# Patient Record
Sex: Female | Born: 1935 | Race: White | Hispanic: No
Health system: Southern US, Community
[De-identification: ages and names within clinical notes are randomized; demographics above are authoritative.]

## PROBLEM LIST (undated history)

## (undated) DIAGNOSIS — F0280 Dementia in other diseases classified elsewhere without behavioral disturbance: Secondary | ICD-10-CM

## (undated) DIAGNOSIS — G309 Alzheimer's disease, unspecified: Secondary | ICD-10-CM

## (undated) DIAGNOSIS — F0390 Unspecified dementia without behavioral disturbance: Secondary | ICD-10-CM

## (undated) DIAGNOSIS — E785 Hyperlipidemia, unspecified: Secondary | ICD-10-CM

## (undated) DIAGNOSIS — I10 Essential (primary) hypertension: Secondary | ICD-10-CM

## (undated) DIAGNOSIS — D649 Anemia, unspecified: Secondary | ICD-10-CM

## (undated) DIAGNOSIS — E079 Disorder of thyroid, unspecified: Secondary | ICD-10-CM

## (undated) DIAGNOSIS — N390 Urinary tract infection, site not specified: Secondary | ICD-10-CM

## (undated) DIAGNOSIS — E039 Hypothyroidism, unspecified: Secondary | ICD-10-CM

## (undated) HISTORY — PX: JOINT REPLACEMENT: SHX530

---

## 2012-01-29 ENCOUNTER — Emergency Department (HOSPITAL_COMMUNITY)
Admission: EM | Admit: 2012-01-29 | Discharge: 2012-01-29 | Disposition: A | Discharge status: Home / Self Care | Payer: Medicare Other | Attending: Emergency Medicine | Admitting: Emergency Medicine

## 2012-01-29 ENCOUNTER — Emergency Department (HOSPITAL_COMMUNITY): Payer: Medicare Other

## 2012-01-29 ENCOUNTER — Encounter (HOSPITAL_COMMUNITY): Payer: Self-pay | Admitting: Emergency Medicine

## 2012-01-29 DIAGNOSIS — Y9210 Unspecified residential institution as the place of occurrence of the external cause: Secondary | ICD-10-CM | POA: Insufficient documentation

## 2012-01-29 DIAGNOSIS — S2249XA Multiple fractures of ribs, unspecified side, initial encounter for closed fracture: Secondary | ICD-10-CM | POA: Insufficient documentation

## 2012-01-29 DIAGNOSIS — W050XXA Fall from non-moving wheelchair, initial encounter: Secondary | ICD-10-CM | POA: Insufficient documentation

## 2012-01-29 DIAGNOSIS — N200 Calculus of kidney: Secondary | ICD-10-CM | POA: Insufficient documentation

## 2012-01-29 DIAGNOSIS — S2231XA Fracture of one rib, right side, initial encounter for closed fracture: Secondary | ICD-10-CM

## 2012-01-29 DIAGNOSIS — R609 Edema, unspecified: Secondary | ICD-10-CM

## 2012-01-29 DIAGNOSIS — Z79899 Other long term (current) drug therapy: Secondary | ICD-10-CM | POA: Insufficient documentation

## 2012-01-29 HISTORY — DX: Dementia in other diseases classified elsewhere, unspecified severity, without behavioral disturbance, psychotic disturbance, mood disturbance, and anxiety: F02.80

## 2012-01-29 HISTORY — DX: Essential (primary) hypertension: I10

## 2012-01-29 HISTORY — DX: Urinary tract infection, site not specified: N39.0

## 2012-01-29 HISTORY — DX: Hypothyroidism, unspecified: E03.9

## 2012-01-29 HISTORY — DX: Disorder of thyroid, unspecified: E07.9

## 2012-01-29 HISTORY — DX: Unspecified dementia, unspecified severity, without behavioral disturbance, psychotic disturbance, mood disturbance, and anxiety: F03.90

## 2012-01-29 HISTORY — DX: Anemia, unspecified: D64.9

## 2012-01-29 HISTORY — DX: Hyperlipidemia, unspecified: E78.5

## 2012-01-29 HISTORY — DX: Alzheimer's disease, unspecified: G30.9

## 2012-01-29 LAB — URINALYSIS, ROUTINE W REFLEX MICROSCOPIC
Glucose, UA: NEGATIVE mg/dL
Ketones, ur: NEGATIVE mg/dL
Protein, ur: >300 mg/dL — AB
Urobilinogen, UA: 0.2 mg/dL (ref 0.0–1.0)

## 2012-01-29 LAB — POCT I-STAT, CHEM 8
Chloride: 106 mEq/L (ref 96–112)
HCT: 34 % — ABNORMAL LOW (ref 36.0–46.0)
Hemoglobin: 11.6 g/dL — ABNORMAL LOW (ref 12.0–15.0)
Potassium: 3.4 mEq/L — ABNORMAL LOW (ref 3.5–5.1)
Sodium: 142 mEq/L (ref 135–145)

## 2012-01-29 LAB — URINE MICROSCOPIC-ADD ON

## 2012-01-29 MED ORDER — ACETAMINOPHEN-CODEINE #3 300-30 MG PO TABS: 1.0000 | ORAL_TABLET | Freq: Four times a day (QID) | ORAL | Status: AC | PRN

## 2012-01-29 MED ORDER — NAPROXEN 500 MG PO TABS
500.0000 mg | ORAL_TABLET | ORAL | Status: AC
  Administered 2012-01-29: 500 mg via ORAL
  Filled 2012-01-29: qty 1

## 2012-01-29 MED ORDER — FUROSEMIDE 20 MG PO TABS: 20.0000 mg | ORAL_TABLET | Freq: Two times a day (BID) | ORAL | Status: AC

## 2012-01-29 MED ORDER — NAPROXEN 500 MG PO TABS: 500.0000 mg | ORAL_TABLET | Freq: Two times a day (BID) | ORAL | Status: AC

## 2012-01-29 NOTE — ED Notes (Addendum)
Pt seating in chair and slip off the chair to the floor, staff stated that pt  denies any injury and pain, wanted pt to be evaluated,  Called EMs

## 2012-01-29 NOTE — ED Provider Notes (Signed)
History     CSN: [id number]  Arrival date & time 01/29/12  9147   First MD Initiated Contact with Patient 01/29/12 [phone]      Chief Complaint  Patient presents with  . Fall    (Consider location/radiation/quality/duration/timing/severity/associated sxs/prior treatment) HPI Comments: 78 year old female who presents after sliding out of her wheelchair at the nursing home. According to the nursing home staff per the paramedics the patient has been trying to use the bathroom tonight but has been unable to give a urine sample.  The patient states that she needs to use the bathroom and states that she was trying to get to the bathroom when she slid out of her wheelchair this evening. She denies having any abdominal pain, fever, nausea vomiting. She does admit to having right-sided pain and back pain after her fall. She states that she slid out of her wheelchair, she states that she remembers the fall and denies any chest pain or syncopal episodes. This occurred just prior to arrival.  According to the patient's medication record which is the only documentation that accompanies her she has a history of dementia but is not on any diuretics for swelling.  Review of systems and history is limited by dementia  Patient is a 78 y.o. female presenting with fall. The history is provided by the patient and the EMS personnel. History Limited By: Dementia.  Fall    No past medical history on file.  No past surgical history on file.  No family history on file.  History  Substance Use Topics  . Smoking status: Not on file  . Smokeless tobacco: Not on file  . Alcohol Use: No    OB History    Grav Para Term Preterm Abortions TAB SAB Ect Mult Living                  Review of Systems  Unable to perform ROS   Allergies  Review of patient's allergies indicates no known allergies.  Home Medications   Current Outpatient Rx  Name Route Sig Dispense Refill  . ALPRAZOLAM 0.5 MG PO TABS Oral  Take 0.5 mg by mouth 2 (two) times daily as needed. For anxiety/agitation    . ATORVASTATIN CALCIUM 40 MG PO TABS Oral Take 40 mg by mouth at bedtime.    Marland Kitchen BENZONATATE 200 MG PO CAPS Oral Take 200 mg by mouth 3 (three) times daily as needed. For cough    . CIPROFLOXACIN HCL 500 MG PO TABS Oral Take 500 mg by mouth 2 (two) times daily. For 10 days    . DONEPEZIL HCL 10 MG PO TABS Oral Take 10 mg by mouth at bedtime.    . FOSINOPRIL SODIUM 20 MG PO TABS Oral Take 20 mg by mouth daily.    Marland Kitchen HYDROCODONE-ACETAMINOPHEN 5-500 MG PO TABS Oral Take 1 tablet by mouth every 6 (six) hours as needed. (Not to exceed 4 grams of acetaminophen in 24 hours)    . INSULIN GLARGINE 100 UNIT/ML [place] SOLN Subcutaneous Inject 15 Units into the skin at bedtime.    . INSULIN REGULAR HUMAN 100 UNIT/ML IJ SOLN Subcutaneous Inject into the skin 3 (three) times daily before meals.    Marland Kitchen LEVOTHYROXINE SODIUM 125 MCG PO TABS Oral Take 125 mcg by mouth daily.    Marland Kitchen MEMANTINE HCL 10 MG PO TABS Oral Take 10 mg by mouth 2 (two) times daily.    Marland Kitchen METOPROLOL TARTRATE 25 MG PO TABS Oral Take 25 mg by  mouth 2 (two) times daily.    Marland Kitchen MIRTAZAPINE 30 MG PO TABS Oral Take 30 mg by mouth at bedtime.    . NYSTATIN 100000 UNIT/GM EX CREA Topical Apply 1 application topically daily. To vaginal area for 1 month    . PANTOPRAZOLE SODIUM 40 MG PO TBEC Oral Take 40 mg by mouth daily.    Marland Kitchen POLYETHYLENE GLYCOL 3350 PO PACK Oral Take 17 g by mouth daily as needed. For constipation    . POTASSIUM CHLORIDE CRYS ER 20 MEQ PO TBCR Oral Take 20 mEq by mouth daily.    . QUETIAPINE FUMARATE 25 MG PO TABS Oral Take 25 mg by mouth at bedtime.    Marland Kitchen QUETIAPINE FUMARATE 25 MG PO TABS Oral Take 25 mg by mouth every 6 (six) hours as needed. For agitation/anxiety    . VITAMIN D (ERGOCALCIFEROL) 50000 UNITS PO CAPS Oral Take 50,000 Units by mouth every 7 (seven) days. Wednesdays    . DESITIN CREAMY EX Apply externally Apply 1 application topically daily. For red area on  buttocks    . ACETAMINOPHEN-CODEINE #3 300-30 MG PO TABS Oral Take 1-2 tablets by mouth every 6 (six) hours as needed for pain. 15 tablet 0  . FUROSEMIDE 20 MG PO TABS Oral Take 1 tablet (20 mg total) by mouth 2 (two) times daily. 15 tablet 0  . NAPROXEN 500 MG PO TABS Oral Take 1 tablet (500 mg total) by mouth 2 (two) times daily with a meal. 30 tablet 0    BP 150/71  Pulse 79  Temp(Src) 97.3 F (36.3 C) (Oral)  Resp 18  SpO2 91%  Physical Exam  Nursing note and vitals reviewed. Constitutional: She appears well-developed and well-nourished. No distress.  HENT:  Head: Normocephalic and atraumatic.  Mouth/Throat: Oropharynx is clear and moist. No oropharyngeal exudate.       No signs of trauma about the head  Eyes: Conjunctivae and EOM are normal. Pupils are equal, round, and reactive to light. Right eye exhibits no discharge. Left eye exhibits no discharge. No scleral icterus.  Neck: Normal range of motion. Neck supple. No JVD present. No thyromegaly present.  Cardiovascular: Normal rate, regular rhythm, normal heart sounds and intact distal pulses.  Exam reveals no gallop and no friction rub.   No murmur heard. Pulmonary/Chest: Effort normal and breath sounds normal. No respiratory distress. She has no wheezes. She has no rales.  Abdominal: Soft. Bowel sounds are normal. She exhibits no distension and no mass. There is no tenderness.  Musculoskeletal: Normal range of motion. She exhibits edema ( bilateral 2+ pitting edema) and tenderness ( Mild tenderness to the right lateral ribs, lumbar spine is mild).  Lymphadenopathy:    She has no cervical adenopathy.  Neurological: She is alert. Coordination normal.  Skin: Skin is warm and dry. No rash noted. No erythema.  Psychiatric: She has a normal mood and affect. Her behavior is normal.    ED Course  Procedures (including critical care time)  Labs Reviewed  URINALYSIS, ROUTINE W REFLEX MICROSCOPIC - Abnormal; Notable for the  following:    Hgb urine dipstick SMALL (*)    Protein, ur >300 (*)    All other components within normal limits  POCT I-STAT, CHEM 8 - Abnormal; Notable for the following:    Potassium 3.4 (*)    Glucose, Bld 166 (*)    Hemoglobin 11.6 (*)    HCT 34.0 (*)    All other components within normal limits  URINE  MICROSCOPIC-ADD ON  URINE CULTURE   Dg Ribs Unilateral W/chest Right  01/29/2012  *RADIOLOGY REPORT*  Clinical Data: Right lateral rib and lower back pain status post fall.  RIGHT RIBS AND CHEST - 3+ VIEW  Comparison: None.  Findings: The heart size upper normal limits.  Aortic arch atherosclerosis and mild tortuosity.  Mild central vascular congestion.  No focal consolidation or pneumothorax.  There are nondisplaced lateral right ninth and tenth rib fractures. Anterolateral 8th rib is questionable.  High-riding bilateral humeral heads suggests underlying rotator cuff pathology. Correlate with the range of motion to exclude dislocation. Probable calcific tendinosis.  IMPRESSION: Nondisplaced lateral right ninth and tenth rib fractures. Questionable anterolateral 8th rib fracture as well.  No pneumothorax.  Original Report Authenticated By: Waneta Martins, M.D.   Dg Lumbar Spine Complete  01/29/2012  *RADIOLOGY REPORT*  Clinical Data: Low back pain status post fall.  LUMBAR SPINE - COMPLETE 4+ VIEW  Comparison: None.  Findings: Advanced multilevel degenerative changes with anterior osteophytes, facet arthropathy, and disc height loss. Minimal anterior wedging at T12.  No definite acute fracture or dislocation.  No aggressive osseous lesion.  Advanced atherosclerotic calcification of the aorta and branch vessels. Partially imaged S10-3 component of a right hip arthroplasty. There are a couple calcific densities projecting over the right quadrant, favored to reflect renal stones.  Oval calcific density projecting over the left upper quadrant may reflect a small splenic artery pseudoaneurysm.   IMPRESSION: Multilevel degenerative changes.  No acute osseous abnormality identified.  Nonobstructing right renal stones.  Original Report Authenticated By: Waneta Martins, M.D.     1. Rib fracture, right, closed, initial encounter   2. Peripheral edema       MDM  Mild skin abrasion, to the left lateral proximal lower extremity below the knee.  Has diffuse bilateral lower extremity edema.  The acute injuries are to the low back and the R ribs - x-rays pending, r/o UTI.   X-rays show rib fractures on the right, incentive spirometer and pain medication given, pain medicines for home. Lasix started, normal renal function, to followup with family doctor in 2 days.  Definitive Fracture Care:  Definitive fracture care performred for the right rib fractures.   This included analgesia in the ED, incentive spiromoterly,which have been provided.  I have counseled the pt on possible complications of the fractures and signs and symptoms which would mandate return for further care.   Vida Roller, MD 01/29/12 0600

## 2012-01-29 NOTE — ED Notes (Signed)
Pt stated fall of the chair,  stated some pain to hip,

## 2012-01-29 NOTE — ED Notes (Signed)
Patient transported to X-ray 

## 2012-01-29 NOTE — Discharge Instructions (Signed)
You have to small fractures on the right side, he also have excessive swelling in your legs which will require a special fluid pill to help you get rid of some of this fluid. Please have your family Dr. reevaluate you in no more than 48 hours for a repeat check on your legs. If you should develop increased difficulty breathing, fevers or severe cough return to the hospital immediately as occasionally people will develop pneumonia after rib fractures. Use the incentive spirometer every 30 minutes to help prevent lung collapse.  Naprosyn for pain.

## 2012-01-30 LAB — URINE CULTURE
Colony Count: NO GROWTH
Culture: NO GROWTH

## 2015-03-19 ENCOUNTER — Emergency Department (HOSPITAL_COMMUNITY): Payer: Medicare Other

## 2015-03-19 ENCOUNTER — Encounter (HOSPITAL_COMMUNITY): Payer: Self-pay | Admitting: Emergency Medicine

## 2015-03-19 ENCOUNTER — Emergency Department (HOSPITAL_COMMUNITY)
Admission: EM | Admit: 2015-03-19 | Discharge: 2015-03-19 | Disposition: A | Discharge status: Home / Self Care | Payer: Medicare Other | Attending: Emergency Medicine | Admitting: Emergency Medicine

## 2015-03-19 DIAGNOSIS — W182XXA Fall in (into) shower or empty bathtub, initial encounter: Secondary | ICD-10-CM | POA: Insufficient documentation

## 2015-03-19 DIAGNOSIS — I10 Essential (primary) hypertension: Secondary | ICD-10-CM | POA: Insufficient documentation

## 2015-03-19 DIAGNOSIS — Y929 Unspecified place or not applicable: Secondary | ICD-10-CM | POA: Insufficient documentation

## 2015-03-19 DIAGNOSIS — Y9389 Activity, other specified: Secondary | ICD-10-CM | POA: Insufficient documentation

## 2015-03-19 DIAGNOSIS — Z87440 Personal history of urinary (tract) infections: Secondary | ICD-10-CM | POA: Diagnosis not present

## 2015-03-19 DIAGNOSIS — E079 Disorder of thyroid, unspecified: Secondary | ICD-10-CM | POA: Insufficient documentation

## 2015-03-19 DIAGNOSIS — Y92121 Bathroom in nursing home as the place of occurrence of the external cause: Secondary | ICD-10-CM | POA: Insufficient documentation

## 2015-03-19 DIAGNOSIS — G309 Alzheimer's disease, unspecified: Secondary | ICD-10-CM | POA: Diagnosis not present

## 2015-03-19 DIAGNOSIS — Y999 Unspecified external cause status: Secondary | ICD-10-CM | POA: Diagnosis not present

## 2015-03-19 DIAGNOSIS — E119 Type 2 diabetes mellitus without complications: Secondary | ICD-10-CM | POA: Diagnosis not present

## 2015-03-19 DIAGNOSIS — Z79899 Other long term (current) drug therapy: Secondary | ICD-10-CM | POA: Diagnosis not present

## 2015-03-19 DIAGNOSIS — Z792 Long term (current) use of antibiotics: Secondary | ICD-10-CM | POA: Insufficient documentation

## 2015-03-19 DIAGNOSIS — W19XXXA Unspecified fall, initial encounter: Secondary | ICD-10-CM

## 2015-03-19 DIAGNOSIS — Z862 Personal history of diseases of the blood and blood-forming organs and certain disorders involving the immune mechanism: Secondary | ICD-10-CM | POA: Insufficient documentation

## 2015-03-19 DIAGNOSIS — Z043 Encounter for examination and observation following other accident: Secondary | ICD-10-CM | POA: Diagnosis present

## 2015-03-19 DIAGNOSIS — F0280 Dementia in other diseases classified elsewhere without behavioral disturbance: Secondary | ICD-10-CM | POA: Diagnosis not present

## 2015-03-19 DIAGNOSIS — M12811 Other specific arthropathies, not elsewhere classified, right shoulder: Secondary | ICD-10-CM

## 2015-03-19 DIAGNOSIS — Z794 Long term (current) use of insulin: Secondary | ICD-10-CM | POA: Diagnosis not present

## 2015-03-19 DIAGNOSIS — E785 Hyperlipidemia, unspecified: Secondary | ICD-10-CM | POA: Insufficient documentation

## 2015-03-19 DIAGNOSIS — S40911A Unspecified superficial injury of right shoulder, initial encounter: Secondary | ICD-10-CM | POA: Insufficient documentation

## 2015-03-19 NOTE — ED Provider Notes (Addendum)
CSN: [id number]     Arrival date & time 03/19/15  0865 History   First MD Initiated Contact with Patient 03/19/15 [phone]     Chief Complaint  Patient presents with  . Fall   HPI Patient presents to the emergency room for evaluation after a fall in the shower. The patient's a resident of a nursing care facility in the memory care unit. According to the EMS report the patient was in shower chair. She leaned forward and fell out of the chair. The patient complained of back pain to the staff at the facility. They contacted EMS to have her brought to the emergency room for evaluation. The patient does not remember what happened this morning. She is annoyed that she is here in the emergency room. She just wants to get out of here and go back home.  Patient states "nothing is wrong with me." Patient denies any pain. She denies any headache. She denies any difficulty breathing. She specifically denies any pain in her lower back. Past Medical History  Diagnosis Date  . Hypertension   . UTI (lower urinary tract infection)   . Anemia   . Diabetes mellitus   . Dementia   . Thyroid disease   . Hyperlipidemia   . Hypothyroid   . Alzheimer disease    Past Surgical History  Procedure Laterality Date  . Joint replacement     No family history on file. History  Substance Use Topics  . Smoking status: Not on file  . Smokeless tobacco: Not on file  . Alcohol Use: No   OB History    No data available     Review of Systems  All other systems reviewed and are negative.     Allergies  Review of patient's allergies indicates no known allergies.  Home Medications   Prior to Admission medications   Medication Sig Start Date End Date Taking? Authorizing Provider  ALPRAZolam Prudy Feeler) 0.5 MG tablet Take 0.5 mg by mouth 2 (two) times daily as needed. For anxiety/agitation    Historical Provider, MD  atorvastatin (LIPITOR) 40 MG tablet Take 40 mg by mouth at bedtime.    Historical Provider, MD   benzonatate (TESSALON) 200 MG capsule Take 200 mg by mouth 3 (three) times daily as needed. For cough    Historical Provider, MD  ciprofloxacin (CIPRO) 500 MG tablet Take 500 mg by mouth 2 (two) times daily. For 10 days    Historical Provider, MD  donepezil (ARICEPT) 10 MG tablet Take 10 mg by mouth at bedtime.    Historical Provider, MD  fosinopril (MONOPRIL) 20 MG tablet Take 20 mg by mouth daily.    Historical Provider, MD  furosemide (LASIX) 20 MG tablet Take 1 tablet (20 mg total) by mouth 2 (two) times daily. 01/29/12 01/28/13  Eber Hong, MD  HYDROcodone-acetaminophen (VICODIN) 5-500 MG per tablet Take 1 tablet by mouth every 6 (six) hours as needed. (Not to exceed 4 grams of acetaminophen in 24 hours)    Historical Provider, MD  insulin glargine (LANTUS) 100 UNIT/ML injection Inject 15 Units into the skin at bedtime.    Historical Provider, MD  insulin regular (NOVOLIN R,HUMULIN R) 100 units/mL injection Inject into the skin 3 (three) times daily before meals.    Historical Provider, MD  levothyroxine (SYNTHROID, LEVOTHROID) 125 MCG tablet Take 125 mcg by mouth daily.    Historical Provider, MD  memantine (NAMENDA) 10 MG tablet Take 10 mg by mouth 2 (two) times daily.  Historical Provider, MD  metoprolol tartrate (LOPRESSOR) 25 MG tablet Take 25 mg by mouth 2 (two) times daily.    Historical Provider, MD  mirtazapine (REMERON) 30 MG tablet Take 30 mg by mouth at bedtime.    Historical Provider, MD  nystatin cream (MYCOSTATIN) Apply 1 application topically daily. To vaginal area for 1 month    Historical Provider, MD  pantoprazole (PROTONIX) 40 MG tablet Take 40 mg by mouth daily.    Historical Provider, MD  polyethylene glycol (MIRALAX / GLYCOLAX) packet Take 17 g by mouth daily as needed. For constipation    Historical Provider, MD  potassium chloride SA (K-DUR,KLOR-CON) 20 MEQ tablet Take 20 mEq by mouth daily.    Historical Provider, MD  QUEtiapine (SEROQUEL) 25 MG tablet Take 25 mg by  mouth at bedtime.    Historical Provider, MD  QUEtiapine (SEROQUEL) 25 MG tablet Take 25 mg by mouth every 6 (six) hours as needed. For agitation/anxiety    Historical Provider, MD  Vitamin D, Ergocalciferol, (DRISDOL) 50000 UNITS CAPS Take 50,000 Units by mouth every 7 (seven) days. Wednesdays    Historical Provider, MD  Zinc Oxide (DESITIN CREAMY EX) Apply 1 application topically daily. For red area on buttocks    Historical Provider, MD   BP 109/59 mmHg  Pulse 63  Temp(Src) 97.9 F (36.6 C) (Oral)  Resp 18  SpO2 94% Physical Exam  Constitutional: No distress.  Elderly  HENT:  Head: Normocephalic and atraumatic.  Right Ear: External ear normal.  Left Ear: External ear normal.  Eyes: Conjunctivae are normal. Right eye exhibits no discharge. Left eye exhibits no discharge. No scleral icterus.  Neck: Neck supple. No tracheal deviation present.  Cardiovascular: Normal rate, regular rhythm and intact distal pulses.   Pulmonary/Chest: Effort normal and breath sounds normal. No stridor. No respiratory distress. She has no wheezes. She has no rales.  Abdominal: Soft. Bowel sounds are normal. She exhibits no distension. There is no tenderness. There is no rebound and no guarding.  Musculoskeletal: She exhibits no edema or tenderness.       Right shoulder: She exhibits decreased range of motion. She exhibits no bony tenderness and no deformity.       Left shoulder: Normal.       Right hip: Normal. She exhibits normal range of motion, normal strength and no tenderness.       Left hip: Normal. She exhibits normal range of motion, normal strength and no tenderness.       Cervical back: She exhibits no bony tenderness and no swelling.       Thoracic back: She exhibits no bony tenderness and no swelling.       Lumbar back: She exhibits no bony tenderness and no swelling.  Neurological: She is alert. She has normal strength. No cranial nerve deficit (no facial droop, extraocular movements intact, no  slurred speech) or sensory deficit. She exhibits normal muscle tone. She displays no seizure activity. Coordination normal.  Patient is alert and awake, answering questions appropriately, her short-term memory is poor  Skin: Skin is warm and dry. No rash noted. She is not diaphoretic.  Psychiatric: She has a normal mood and affect.  Nursing note and vitals reviewed.   ED Course  Procedures (including critical care time) Labs Review Labs Reviewed - No data to display  Imaging Review Dg Lumbar Spine Complete  03/19/2015   CLINICAL DATA:  Status post fall in the shower this morning. Low back pain. Initial encounter.  EXAM: LUMBAR SPINE - COMPLETE 4+ VIEW  COMPARISON:  Plain films lumbar spine 01/29/2012.  FINDINGS: No fracture is identified. There is multilevel spondylosis without marked interval change. Atherosclerotic vascular disease is noted.  IMPRESSION: No acute abnormality.  Multilevel spondylosis.  Atherosclerosis.   Electronically Signed   By: Drusilla Kannerhomas  Dalessio M.D.   On: 03/19/2015 10:42   Dg Shoulder Right  03/19/2015   CLINICAL DATA:  Status post fall this morning. Right shoulder pain. Initial encounter.  EXAM: RIGHT SHOULDER - 2+ VIEW  COMPARISON:  None.  FINDINGS: No acute bony or joint abnormality is identified. The humeral head is markedly high-riding consistent with chronic rotator cuff tear. There is advanced glenohumeral osteoarthritis. Calcification projecting over the greater tuberosity is most likely due to calcific tendinosis of the rotator cuff. Image right lung and ribs are unremarkable.  IMPRESSION: No acute abnormality.  Advanced glenohumeral osteoarthritis.  High-riding humeral head consistent with chronic rotator cuff tear.   Electronically Signed   By: Drusilla Kannerhomas  Dalessio M.D.   On: 03/19/2015 10:39     MDM   Final diagnoses:  Fall, initial encounter  Rotator cuff arthropathy, right    Pt had a fall in the bathtub today.  Pt denies any complaints.  She had some  soreness with shoulder movement.  It appears to be related to her chronic arthropathy.  No fracture or dislocation.  She denies back pain and has no ttp on exam.  Doubt head injury.  No external signs of injury.  Neuro exam is normal.  No anticoagulants.    At this time there does not appear to be any evidence of an acute emergency medical condition and the patient appears stable for discharge with appropriate outpatient follow up.    Linwood DibblesJon Nautica Hotz, MD 03/19/15 1051  We attempted to get the patient to walk before discharge but she refused to do so.  She said she could walk fine but did not want to.  Daughter was contacted who informed us that the patient does not walk anymore an uses a wheelchair.  She forgets that she uses the wheelchair.  Linwood DibblesJon Ramani Riva, MD 03/19/15 [phone]

## 2015-03-19 NOTE — ED Notes (Signed)
Pt refusing to get up.   Spoke with pt's daughter on the phone, who informed me that pt is wheelchair bound and doesn't walk at all.

## 2015-03-19 NOTE — ED Notes (Signed)
Per EMS-sitting in shower and went to move and fell to supine position-did not loose consciousness-[id number]-complaining of back pain, per nursing home, she always complains of back pain-history of dementia-no change in mental status per nursing facility

## 2015-03-19 NOTE — Discharge Instructions (Signed)
Arthritis, Nonspecific °Arthritis is pain, redness, warmth, or puffiness (inflammation) of a joint. The joint may be stiff or hurt when you move it. One or more joints may be affected. There are many types of arthritis. Your doctor may not know what type you have right away. The most common cause of arthritis is wear and tear on the joint (osteoarthritis). °HOME CARE  °· Only take medicine as told by your doctor. °· Rest the joint as much as possible. °· Raise (elevate) your joint if it is puffy. °· Use crutches if the painful joint is in your leg. °· Drink enough fluids to keep your pee (urine) clear or pale yellow. °· Follow your doctor's diet instructions. °· Use cold packs for very bad joint pain for 10 to 15 minutes every hour. Ask your doctor if it is okay for you to use hot packs. °· Exercise as told by your doctor. °· Take a warm shower if you have stiffness in the morning. °· Move your sore joints throughout the day. °GET HELP RIGHT AWAY IF:  °· You have a fever. °· You have very bad joint pain, puffiness, or redness. °· You have many joints that are painful and puffy. °· You are not getting better with treatment. °· You have very bad back pain or leg weakness. °· You cannot control when you poop (bowel movement) or pee (urinate). °· You do not feel better in 24 hours or are getting worse. °· You are having side effects from your medicine. °MAKE SURE YOU:  °· Understand these instructions. °· Will watch your condition. °· Will get help right away if you are not doing well or get worse. °Document Released: 11/22/2009 Document Revised: 02/27/2012 Document Reviewed: 11/22/2009 °[employer]® Patient Information ©2015 [employer]. This information is not intended to replace advice given to you by your health care provider. Make sure you discuss any questions you have with your health care provider. ° °

## 2015-03-19 NOTE — ED Notes (Signed)
Bed: WA14 Expected date:  Expected time:  Means of arrival:  Comments: EMS-fall 

## 2015-05-29 ENCOUNTER — Emergency Department (HOSPITAL_COMMUNITY)
Admission: EM | Admit: 2015-05-29 | Discharge: 2015-05-29 | Disposition: A | Discharge status: Home / Self Care | Payer: Medicare Other | Attending: Emergency Medicine | Admitting: Emergency Medicine

## 2015-05-29 ENCOUNTER — Emergency Department (HOSPITAL_COMMUNITY): Payer: Medicare Other

## 2015-05-29 DIAGNOSIS — E039 Hypothyroidism, unspecified: Secondary | ICD-10-CM | POA: Diagnosis not present

## 2015-05-29 DIAGNOSIS — Y998 Other external cause status: Secondary | ICD-10-CM | POA: Insufficient documentation

## 2015-05-29 DIAGNOSIS — Z79899 Other long term (current) drug therapy: Secondary | ICD-10-CM | POA: Diagnosis not present

## 2015-05-29 DIAGNOSIS — S3992XA Unspecified injury of lower back, initial encounter: Secondary | ICD-10-CM | POA: Insufficient documentation

## 2015-05-29 DIAGNOSIS — G309 Alzheimer's disease, unspecified: Secondary | ICD-10-CM | POA: Insufficient documentation

## 2015-05-29 DIAGNOSIS — Y9289 Other specified places as the place of occurrence of the external cause: Secondary | ICD-10-CM | POA: Diagnosis not present

## 2015-05-29 DIAGNOSIS — Z862 Personal history of diseases of the blood and blood-forming organs and certain disorders involving the immune mechanism: Secondary | ICD-10-CM | POA: Insufficient documentation

## 2015-05-29 DIAGNOSIS — I10 Essential (primary) hypertension: Secondary | ICD-10-CM | POA: Insufficient documentation

## 2015-05-29 DIAGNOSIS — E119 Type 2 diabetes mellitus without complications: Secondary | ICD-10-CM | POA: Insufficient documentation

## 2015-05-29 DIAGNOSIS — W1839XA Other fall on same level, initial encounter: Secondary | ICD-10-CM | POA: Diagnosis not present

## 2015-05-29 DIAGNOSIS — Z87440 Personal history of urinary (tract) infections: Secondary | ICD-10-CM | POA: Diagnosis not present

## 2015-05-29 DIAGNOSIS — F0280 Dementia in other diseases classified elsewhere without behavioral disturbance: Secondary | ICD-10-CM | POA: Insufficient documentation

## 2015-05-29 DIAGNOSIS — Y9389 Activity, other specified: Secondary | ICD-10-CM | POA: Diagnosis not present

## 2015-05-29 DIAGNOSIS — Z794 Long term (current) use of insulin: Secondary | ICD-10-CM | POA: Diagnosis not present

## 2015-05-29 DIAGNOSIS — W19XXXA Unspecified fall, initial encounter: Secondary | ICD-10-CM

## 2015-05-29 NOTE — ED Notes (Signed)
Called [facility] for transport back to [facility].

## 2015-05-29 NOTE — ED Provider Notes (Signed)
CSN: [id number]     Arrival date & time 05/29/15  7829 History   First MD Initiated Contact with Patient 05/29/15 [phone]     Chief Complaint  Patient presents with  . Fall  . Back Pain      HPI  Expand All Collapse All   Pt from [facility] SNF: Per ems report: Staff there had gotten her up and dressed and into her chair. They heard her yelling and found her on the floor. Pt has had recent history of the same in the last few days. For the other "fall" EMS went out there but daughter didn't want her brought in. Since this is the 2nd "fall" in 3 days daughter would like her evaluated. Pt is demented at baseline and poor historian. Does c/o of back pain to EMS but they didn't see any signs of injury        Past Medical History  Diagnosis Date  . Hypertension   . UTI (lower urinary tract infection)   . Anemia   . Diabetes mellitus   . Dementia   . Thyroid disease   . Hyperlipidemia   . Hypothyroid   . Alzheimer disease    Past Surgical History  Procedure Laterality Date  . Joint replacement     No family history on file. Social History  Substance Use Topics  . Smoking status: Not on file  . Smokeless tobacco: Not on file  . Alcohol Use: No   OB History    No data available     Review of Systems  Unable to perform ROS: Dementia  Endocrine: Positive for heat intolerance.      Allergies  Review of patient's allergies indicates no known allergies.  Home Medications   Prior to Admission medications   Medication Sig Start Date End Date Taking? Authorizing Provider  acetaminophen (TYLENOL) 500 MG tablet Take 500 mg by mouth 2 (two) times daily.    Historical Provider, MD  busPIRone (BUSPAR) 5 MG tablet Take 5 mg by mouth 2 (two) times daily.    Historical Provider, MD  cholecalciferol (VITAMIN D) 1000 UNITS tablet Take 1,000 Units by mouth daily.    Historical Provider, MD  Cranberry 450 MG CAPS Take 450 mg by mouth 2 (two) times daily.    Historical  Provider, MD  divalproex (DEPAKOTE) 250 MG DR tablet Take 250 mg by mouth 2 (two) times daily.    Historical Provider, MD  donepezil (ARICEPT) 10 MG tablet Take 10 mg by mouth at bedtime.    Historical Provider, MD  fosinopril (MONOPRIL) 20 MG tablet Take 20 mg by mouth daily.    Historical Provider, MD  furosemide (LASIX) 20 MG tablet Take 1 tablet (20 mg total) by mouth 2 (two) times daily. 01/29/12 03/19/15  Eber Hong, MD  insulin glargine (LANTUS) 100 UNIT/ML injection Inject 21 Units into the skin at bedtime.     Historical Provider, MD  insulin regular (NOVOLIN R,HUMULIN R) 100 units/mL injection Inject 6 Units into the skin 2 (two) times daily before a meal.     Historical Provider, MD  levothyroxine (SYNTHROID, LEVOTHROID) 150 MCG tablet Take 150 mcg by mouth daily before breakfast.    Historical Provider, MD  memantine (NAMENDA) 10 MG tablet Take 10 mg by mouth at bedtime.     Historical Provider, MD  memantine (NAMENDA) 5 MG tablet Take 5 mg by mouth 2 (two) times daily.    Historical Provider, MD  metoprolol tartrate (LOPRESSOR) 25 MG  tablet Take 12.5 mg by mouth 2 (two) times daily.     Historical Provider, MD  mirtazapine (REMERON) 30 MG tablet Take 30 mg by mouth at bedtime.    Historical Provider, MD  Multiple Vitamins-Minerals (MULTIVITAMIN ADULT PO) Take 1 tablet by mouth daily.    Historical Provider, MD  potassium chloride SA (K-DUR,KLOR-CON) 20 MEQ tablet Take 20 mEq by mouth daily.    Historical Provider, MD  QUEtiapine (SEROQUEL) 25 MG tablet Take 25 mg by mouth at bedtime.    Historical Provider, MD   BP 117/56 mmHg  Pulse 71  Temp(Src) 99 F (37.2 C) (Axillary)  Resp 18  Ht 5\' 5"  (1.651 m)  Wt 165 lb (74.844 kg)  BMI 27.46 kg/m2  SpO2 92% Physical Exam  Constitutional: She is oriented to person, place, and time. She appears well-developed and well-nourished. No distress.  HENT:  Head: Normocephalic and atraumatic.  Eyes: Pupils are equal, round, and reactive to  light.  Neck: Normal range of motion.  Cardiovascular: Normal rate and intact distal pulses.   Pulmonary/Chest: No respiratory distress.  Abdominal: Normal appearance. She exhibits no distension.  Musculoskeletal: Normal range of motion.  Patient refuses a full exam.  Has no focal tenderness on her lumbar thoracic area to brief exam.  No signs of trauma.  Neurological: She is alert and oriented to person, place, and time. No cranial nerve deficit.  Skin: Skin is warm and dry. No rash noted.  Psychiatric: Her speech is normal. Her affect is blunt. She is agitated and aggressive.  Nursing note and vitals reviewed.   ED Course  Procedures (including critical care time) Labs Review Labs Reviewed - No data to display  Imaging Review Dg Lumbar Spine Complete  05/29/2015   CLINICAL DATA:  Larey Seat today at the nursing home.  Back pain.  EXAM: LUMBAR SPINE - COMPLETE 4+ VIEW  COMPARISON:  03/19/2015  FINDINGS: Stable degenerative lumbar spondylosis with multilevel disc disease and facet disease. The overall alignment is maintained. No acute compression fracture. Advanced aortoiliac calcifications are again noted. A right hip prosthesis is noted.  IMPRESSION: Stable degenerative lumbar spondylosis with multilevel disc disease and facet disease.  No acute fracture.   Electronically Signed   By: Rudie Meyer M.D.   On: 05/29/2015 09:49   I have personally reviewed and evaluated these images and lab results as part of my medical decision-making.    MDM   Final diagnoses:  Fall, initial encounter        Nelva Nay, MD 05/29/15 1007

## 2015-05-29 NOTE — ED Notes (Signed)
Pt from [facility] SNF:  Per ems report:  Staff there had gotten her up and dressed and into her chair.  They heard her yelling and found her on the floor.  Pt has had recent history of the same in the last few days.  For the other "fall" EMS went out there but daughter didn't want her brought in.  Since this is the 2nd "fall" in 3 days daughter would like her evaluated.  Pt is demented at baseline and poor historian.  Does c/o of back pain to EMS but they didn't see any signs of injury anywhere.  Pt's paperwork states she's non-ambulatory and they believe she's falling d/t trying to get OOB.  VS: 140/84, 82reg, 18, CBG 131.

## 2015-05-29 NOTE — Discharge Instructions (Signed)

## 2015-05-29 NOTE — ED Notes (Signed)
Bed: WA19 Expected date:  Expected time:  Means of arrival:  Comments: Ems-fall 

## 2015-05-29 NOTE — ED Notes (Addendum)
Pt is belligerent and refusing to be undressed or assessed.  She won't answer questions and is angry.

## 2015-06-03 ENCOUNTER — Emergency Department (HOSPITAL_COMMUNITY): Payer: Medicare Other

## 2015-06-03 ENCOUNTER — Emergency Department (HOSPITAL_COMMUNITY)
Admission: EM | Admit: 2015-06-03 | Discharge: 2015-06-03 | Disposition: A | Discharge status: Home / Self Care | Payer: Medicare Other | Attending: Emergency Medicine | Admitting: Emergency Medicine

## 2015-06-03 DIAGNOSIS — S3992XA Unspecified injury of lower back, initial encounter: Secondary | ICD-10-CM | POA: Insufficient documentation

## 2015-06-03 DIAGNOSIS — E039 Hypothyroidism, unspecified: Secondary | ICD-10-CM | POA: Insufficient documentation

## 2015-06-03 DIAGNOSIS — E079 Disorder of thyroid, unspecified: Secondary | ICD-10-CM | POA: Diagnosis not present

## 2015-06-03 DIAGNOSIS — Y998 Other external cause status: Secondary | ICD-10-CM | POA: Diagnosis not present

## 2015-06-03 DIAGNOSIS — E119 Type 2 diabetes mellitus without complications: Secondary | ICD-10-CM | POA: Insufficient documentation

## 2015-06-03 DIAGNOSIS — Y9289 Other specified places as the place of occurrence of the external cause: Secondary | ICD-10-CM | POA: Diagnosis not present

## 2015-06-03 DIAGNOSIS — G309 Alzheimer's disease, unspecified: Secondary | ICD-10-CM | POA: Insufficient documentation

## 2015-06-03 DIAGNOSIS — Y9389 Activity, other specified: Secondary | ICD-10-CM | POA: Insufficient documentation

## 2015-06-03 DIAGNOSIS — F0280 Dementia in other diseases classified elsewhere without behavioral disturbance: Secondary | ICD-10-CM | POA: Insufficient documentation

## 2015-06-03 DIAGNOSIS — W1839XA Other fall on same level, initial encounter: Secondary | ICD-10-CM | POA: Diagnosis not present

## 2015-06-03 DIAGNOSIS — Z79899 Other long term (current) drug therapy: Secondary | ICD-10-CM | POA: Insufficient documentation

## 2015-06-03 DIAGNOSIS — Z862 Personal history of diseases of the blood and blood-forming organs and certain disorders involving the immune mechanism: Secondary | ICD-10-CM | POA: Diagnosis not present

## 2015-06-03 DIAGNOSIS — Z87440 Personal history of urinary (tract) infections: Secondary | ICD-10-CM | POA: Diagnosis not present

## 2015-06-03 DIAGNOSIS — W19XXXA Unspecified fall, initial encounter: Secondary | ICD-10-CM

## 2015-06-03 DIAGNOSIS — I10 Essential (primary) hypertension: Secondary | ICD-10-CM | POA: Diagnosis not present

## 2015-06-03 NOTE — ED Notes (Signed)
[facility] called for transport.  

## 2015-06-03 NOTE — ED Notes (Signed)
Attempted to call reports to [facility] [place]. No answer. Message left.

## 2015-06-03 NOTE — ED Notes (Signed)
Per EMS- Patient is a resident of [facility]. Staff reported that the patient fell 5 days ago and was seen at [facility]. No abnormalities noted. Patient was then transported back to the ECF. Staff also reports that the patient continues to c/o pain. Patient has a history of Alzheimer's

## 2015-06-03 NOTE — ED Notes (Signed)
Bed: WHALD Expected date:  Expected time:  Means of arrival:  Comments: 

## 2015-06-03 NOTE — Discharge Instructions (Signed)

## 2015-06-03 NOTE — Progress Notes (Signed)
CSW attempted to speak with patient at bedside. However, patient became slightly irritated and repeatedly stated " Please, leave me alone". There was no family present.  Per note, the patient is from [facility] and staff reports pt fell 5 days ago; Patient was seen at [facility].  Patient has a history of Alzheimer disease.   Trish Mage [phone] ED CSW 06/03/2015 5:52 PM

## 2015-06-03 NOTE — ED Provider Notes (Signed)
CSN: [id number]     Arrival date & time 06/03/15  1507 History   First MD Initiated Contact with Patient 06/03/15 1527     Chief Complaint  Patient presents with  . Pain     (Consider location/radiation/quality/duration/timing/severity/associated sxs/prior Treatment) Patient is a 81 y.o. female presenting with fall. The history is provided by the patient, the nursing home and the EMS personnel.  Fall This is a new problem. The current episode started more than 1 week ago. The problem occurs rarely. The problem has not changed since onset.Pertinent negatives include no chest pain, no headaches and no shortness of breath. Nothing aggravates the symptoms. Nothing relieves the symptoms. She has tried nothing for the symptoms. The treatment provided no relief.   81 yo F with a chief complaint of a fall. This happened about a week ago. Patient was seen at [facility] had a negative x-ray of the L-spine. Per nursing home patient continues to have pain. Sent here for further eval. Unsure exact location of pain. Patient noncooperative with exam. Repeats over and over again just leave me alone i am fine.  Past Medical History  Diagnosis Date  . Hypertension   . UTI (lower urinary tract infection)   . Anemia   . Diabetes mellitus   . Dementia   . Thyroid disease   . Hyperlipidemia   . Hypothyroid   . Alzheimer disease    Past Surgical History  Procedure Laterality Date  . Joint replacement     No family history on file. Social History  Substance Use Topics  . Smoking status: Not on file  . Smokeless tobacco: Not on file  . Alcohol Use: No   OB History    No data available     Review of Systems  Unable to perform ROS: Other  Constitutional: Negative for fever and chills.  HENT: Negative for congestion and rhinorrhea.   Eyes: Negative for redness and visual disturbance.  Respiratory: Negative for shortness of breath and wheezing.   Cardiovascular: Negative for chest pain and  palpitations.  Gastrointestinal: Negative for nausea, vomiting and constipation.  Genitourinary: Negative for dysuria and urgency.  Musculoskeletal: Positive for myalgias, back pain and arthralgias.  Skin: Negative for pallor and wound.  Neurological: Negative for dizziness and headaches.      Allergies  Review of patient's allergies indicates no known allergies.  Home Medications   Prior to Admission medications   Medication Sig Start Date End Date Taking? Authorizing Provider  acetaminophen (TYLENOL) 500 MG tablet Take 500 mg by mouth 2 (two) times daily.   Yes Historical Provider, MD  ALPRAZolam (XANAX) 0.25 MG tablet Take 0.25 mg by mouth 2 (two) times daily.   Yes Historical Provider, MD  cholecalciferol (VITAMIN D) 1000 UNITS tablet Take 1,000 Units by mouth daily.   Yes Historical Provider, MD  Cranberry 450 MG CAPS Take 450 mg by mouth 2 (two) times daily.   Yes Historical Provider, MD  divalproex (DEPAKOTE) 250 MG DR tablet Take 375 mg by mouth 2 (two) times daily.    Yes Historical Provider, MD  donepezil (ARICEPT) 10 MG tablet Take 10 mg by mouth at bedtime.   Yes Historical Provider, MD  fosinopril (MONOPRIL) 20 MG tablet Take 20 mg by mouth daily.   Yes Historical Provider, MD  furosemide (LASIX) 20 MG tablet Take 1 tablet (20 mg total) by mouth 2 (two) times daily. Patient taking differently: Take 20 mg by mouth daily.  01/29/12 06/03/15 Yes Arlys John  Hyacinth Meeker, MD  HUMULIN R 100 UNIT/ML injection Inject 6 Units into the skin 2 (two) times daily before a meal. 05/19/15  Yes Historical Provider, MD  LANTUS 100 UNIT/ML injection Inject 21 Units into the skin at bedtime. 05/19/15  Yes Historical Provider, MD  levothyroxine (SYNTHROID, LEVOTHROID) 150 MCG tablet Take 150 mcg by mouth daily before breakfast.   Yes Historical Provider, MD  memantine (NAMENDA) 5 MG tablet Take 5 mg by mouth 2 (two) times daily.   Yes Historical Provider, MD  metoprolol tartrate (LOPRESSOR) 25 MG tablet Take  12.5 mg by mouth 2 (two) times daily.    Yes Historical Provider, MD  mirtazapine (REMERON) 30 MG tablet Take 15 mg by mouth at bedtime.    Yes Historical Provider, MD  Multiple Vitamins-Minerals (MULTIVITAMIN ADULT PO) Take 1 tablet by mouth daily.   Yes Historical Provider, MD  omeprazole (PRILOSEC) 20 MG capsule Take 20 mg by mouth daily.   Yes Historical Provider, MD  potassium chloride SA (K-DUR,KLOR-CON) 20 MEQ tablet Take 20 mEq by mouth daily.   Yes Historical Provider, MD  QUEtiapine (SEROQUEL) 25 MG tablet Take 25 mg by mouth 2 (two) times daily.    Yes Historical Provider, MD   BP 117/59 mmHg  Pulse 75  Temp(Src) 98.5 F (36.9 C) (Oral)  Resp 16  SpO2 95% Physical Exam  Constitutional: She appears well-developed and well-nourished. No distress.  HENT:  Head: Normocephalic and atraumatic.  Eyes: EOM are normal. Pupils are equal, round, and reactive to light.  Neck: Normal range of motion. Neck supple.  Cardiovascular: Normal rate and regular rhythm.  Exam reveals no gallop and no friction rub.   No murmur heard. Pulmonary/Chest: Effort normal. She has no wheezes. She has no rales.  Abdominal: Soft. She exhibits no distension. There is no tenderness. There is no rebound and no guarding.  Musculoskeletal: She exhibits tenderness (tender palpation about the left leg. Unable to localize, as patient refusing to cooperate with exam). She exhibits no edema.  Neurological: She is alert.  Skin: Skin is warm and dry. She is not diaphoretic.  Psychiatric: She has a normal mood and affect. Her behavior is normal.    ED Course  Procedures (including critical care time) Labs Review Labs Reviewed - No data to display  Imaging Review Dg Lumbar Spine 2-3 Views  06/03/2015   CLINICAL DATA:  Larey Seat 5 days ago.  Persistent pain.  EXAM: LEFT FEMUR 2 VIEWS; LUMBAR SPINE - 2-3 VIEW  COMPARISON:  05/29/2015  FINDINGS: Lumbar spine:  Stable degenerative of lumbar spondylosis and mild left convex  scoliosis. No acute bony findings or destructive bony lesions. Stable aortoiliac calcifications. Stable advanced degenerative disc disease at L5-S1. The visualized bony pelvis is grossly normal. A right hip prosthesis is noted.  Left femur:  The hip and knee joints are maintained. No acute femur fracture. Stable vascular calcifications. The visualized bony pelvis is intact.  IMPRESSION: No acute bony findings.   Electronically Signed   By: Rudie Meyer M.D.   On: 06/03/2015 17:52   Dg Knee 2 Views Left  06/03/2015   CLINICAL DATA:  Per EMS- Patient is a resident of [facility]. Staff reported that the patient fell 5 days ago and was seen at [facility]. No abnormalities noted. Patient was then transported back to the ECF. Continues to complain of pain. Dementia.  EXAM: LEFT KNEE - 1-2 VIEW  COMPARISON:  None.  FINDINGS: Bones are osteopenic. Moderate joint effusion is present. There  is mild degenerative change involving the lateral patellofemoral compartments. Note is made of atherosclerotic calcification of the popliteal artery.  IMPRESSION: 1. Moderate joint effusion. 2. No acute fracture. 3. Suspect osteopenia/osteoporosis. 4. Mild degenerative changes.   Electronically Signed   By: Norva Pavlov M.D.   On: 06/03/2015 17:49   Dg Tibia/fibula Left  06/03/2015   CLINICAL DATA:  Patient is a resident of [facility]. Staff reported that the patient fell 5 days ago and was seen at [facility]. No abnormalities noted. Patient was then transported back to the ECF. Staff also reports that the patient continues to c/o pain. Patient has a history of Alzheimer's.  EXAM: LEFT TIBIA AND FIBULA - 2 VIEW  COMPARISON:  None.  FINDINGS: No fracture. No bone lesion. Knee and ankle joints are normally aligned. Bones are diffusely demineralized. Soft tissues are unremarkable other than vascular calcifications.  IMPRESSION: No fracture or dislocation.   Electronically Signed   By: Amie Portland M.D.   On: 06/03/2015 17:49    Dg Hip Unilat With Pelvis 2-3 Views Left  06/03/2015   CLINICAL DATA:  Per EMS- Patient is a resident of [facility]. Staff reported that the patient fell 5 days ago and was seen at [facility]. No abnormalities noted. Patient was then transported back to the ECF. Staff also reports that the patient continues to complain of pain. History of Alzheimer's disease.  EXAM: DG HIP (WITH OR WITHOUT PELVIS) 2-3V LEFT  COMPARISON:  None.  FINDINGS: No fracture or bone lesion. Left hip joint is normally spaced and aligned with no arthropathic change. SI joints and symphysis pubis are normally aligned. Single view of the right hip prosthesis shows it to be well aligned. Bones are diffusely demineralized. Soft tissues are unremarkable.  IMPRESSION: No acute fracture.  No dislocation.   Electronically Signed   By: Amie Portland M.D.   On: 06/03/2015 17:48   Dg Femur Min 2 Views Left  06/03/2015   CLINICAL DATA:  Larey Seat 5 days ago.  Persistent pain.  EXAM: LEFT FEMUR 2 VIEWS; LUMBAR SPINE - 2-3 VIEW  COMPARISON:  05/29/2015  FINDINGS: Lumbar spine:  Stable degenerative of lumbar spondylosis and mild left convex scoliosis. No acute bony findings or destructive bony lesions. Stable aortoiliac calcifications. Stable advanced degenerative disc disease at L5-S1. The visualized bony pelvis is grossly normal. A right hip prosthesis is noted.  Left femur:  The hip and knee joints are maintained. No acute femur fracture. Stable vascular calcifications. The visualized bony pelvis is intact.  IMPRESSION: No acute bony findings.   Electronically Signed   By: Rudie Meyer M.D.   On: 06/03/2015 17:52   I have personally reviewed and evaluated these images and lab results as part of my medical decision-making.   EKG Interpretation None      MDM   Final diagnoses:  Fall, initial encounter    81 yo F with a chief complaint of a fall. Patient uncooperative unable to roll on exam. Tender palpation about the left lower extremity  worse at the knee and the hip. Will obtain plain films. Repeat film of the L-spine.  No noted injuries to the left hip left knee or L-spine. Will discharge.   I have discussed the diagnosis/risks/treatment options with the patient and believe the pt to be eligible for discharge home to follow-up with PCP. We also discussed returning to the ED immediately if new or worsening sx occur. We discussed the sx which are most concerning (e.g., sudden  worsening symptoms) that necessitate immediate return. Medications administered to the patient during their visit and any new prescriptions provided to the patient are listed below.  Medications given during this visit Medications - No data to display  Discharge Medication List as of 06/03/2015  5:58 PM       The patient appears reasonably screen and/or stabilized for discharge and I doubt any other medical condition or other [facility] requiring further screening, evaluation, or treatment in the ED at this time prior to discharge.     Melene Plan, DO 06/03/15 2359

## 2015-07-13 DEATH — deceased

## 2016-09-20 IMAGING — CR DG LUMBAR SPINE COMPLETE 4+V
5 series · 5 of 5 positions shown · non-contrast
Comparison: 03/19/2015

CLINICAL DATA: Fell today at the [HOSPITAL].  Back pain.

EXAM:
LUMBAR SPINE - COMPLETE 4+ VIEW

[t lumbar spine ap]
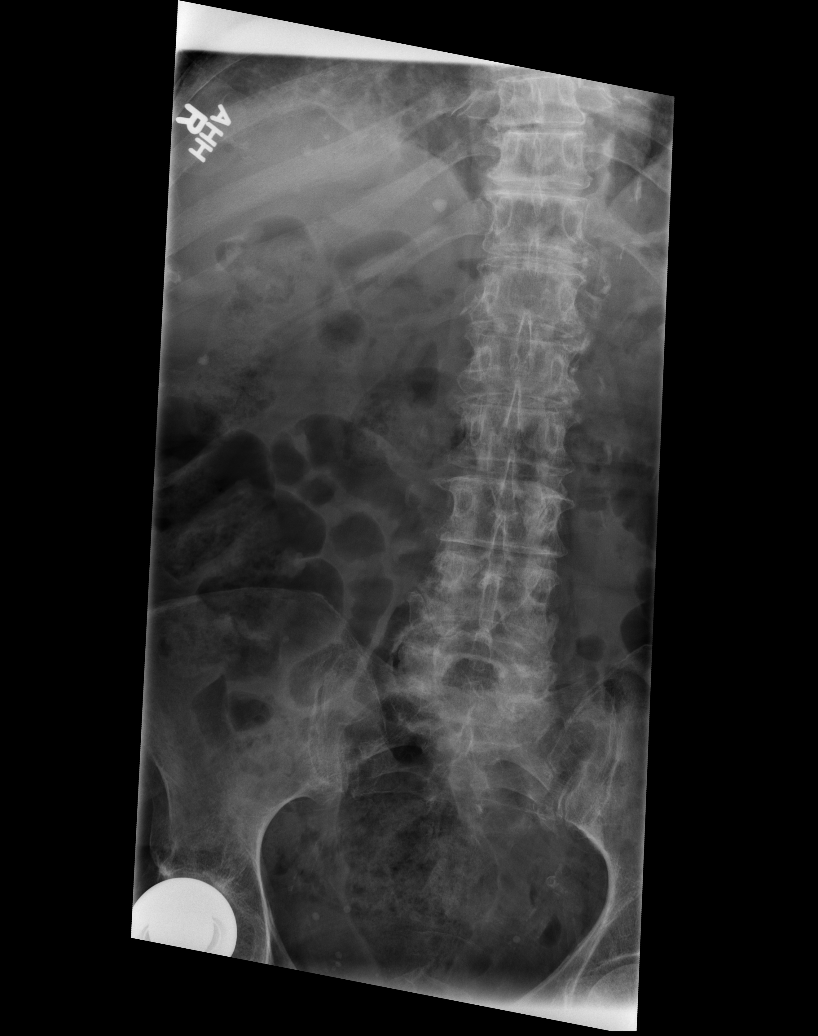

[t lumbar spine obl (1 of 2)]
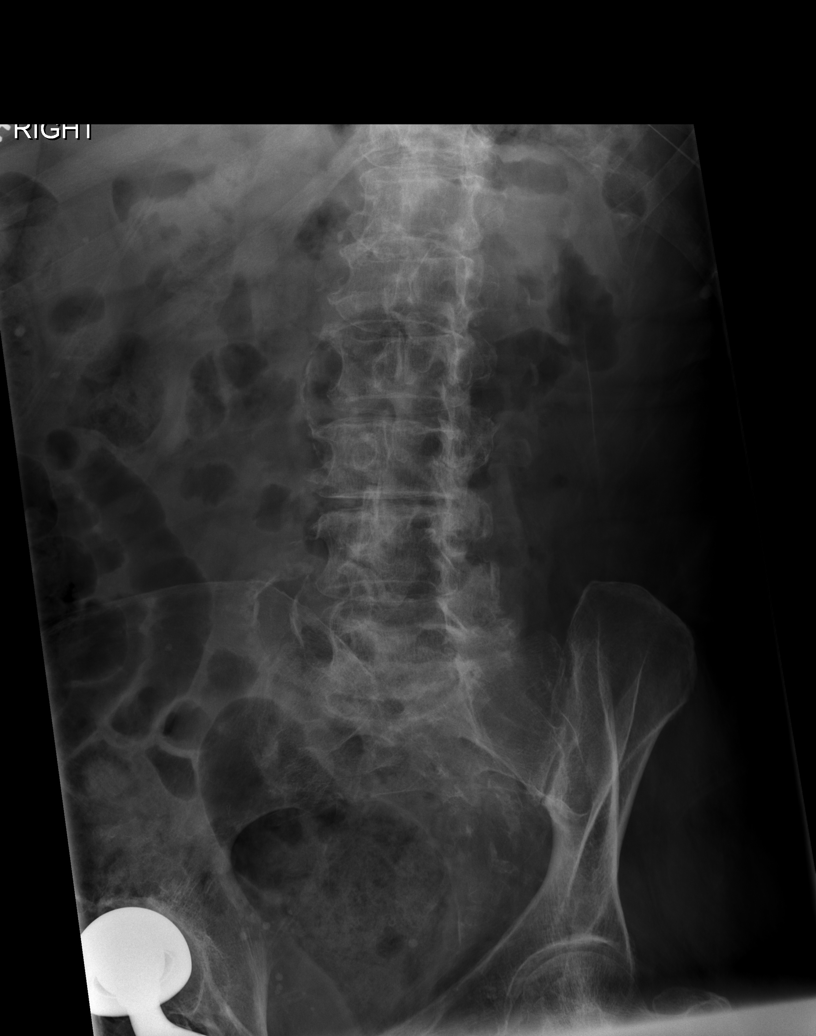

[t lumbar spine obl (2 of 2)]
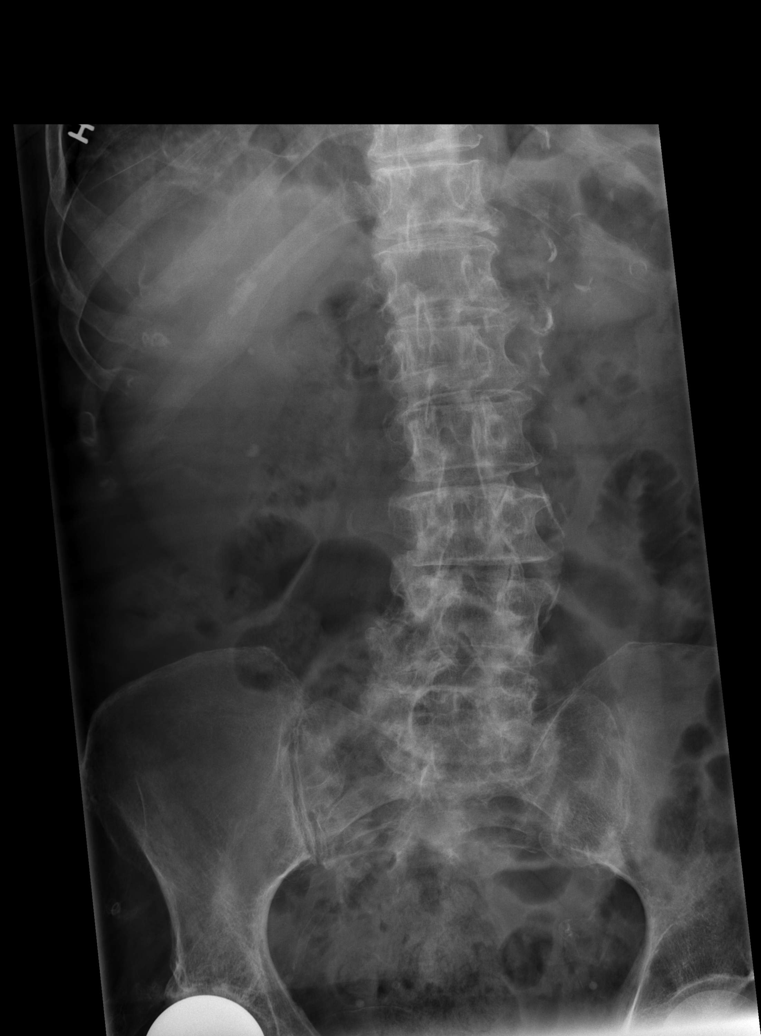

[t lumbar spine lat]
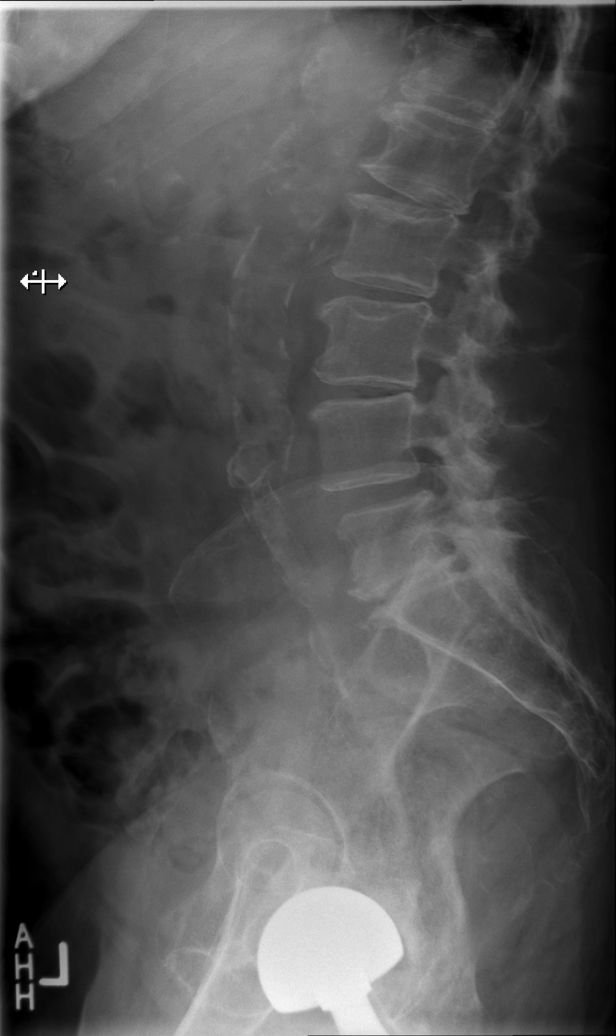

[t lumbar l-5 s-1 spot]
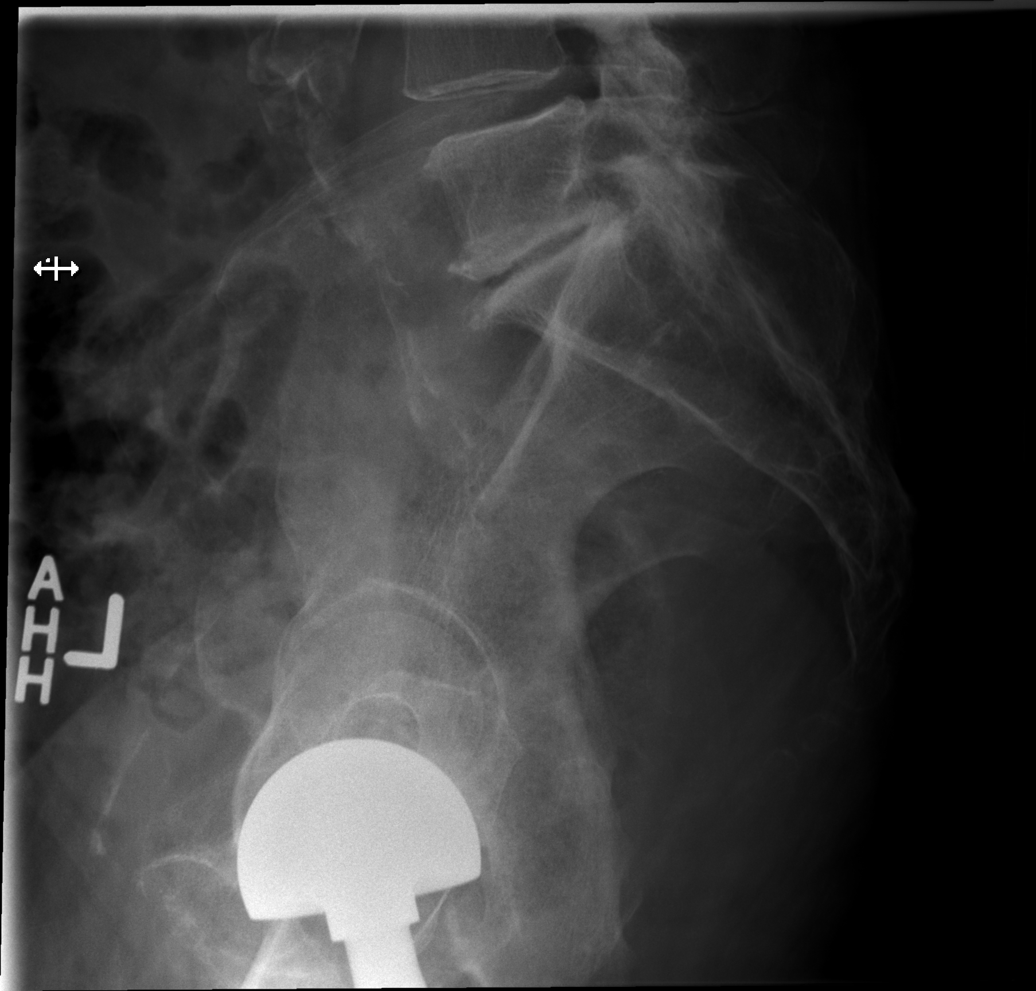

[5 of 5 positions shown; findings below may reference images not displayed]

FINDINGS: Stable degenerative lumbar spondylosis with multilevel disc disease
and facet disease. The overall alignment is maintained. No acute
compression fracture. Advanced aortoiliac calcifications are again
noted. A right hip prosthesis is noted.
IMPRESSION: Stable degenerative lumbar spondylosis with multilevel disc disease
and facet disease.

No acute fracture.
